# Patient Record
Sex: Female | Born: 1965 | Race: White | Hispanic: No | Marital: Married | Smoking: Never smoker
Health system: Southern US, Community
[De-identification: ages and names within clinical notes are randomized; demographics above are authoritative.]

---

## 2007-09-18 ENCOUNTER — Ambulatory Visit: Payer: Self-pay | Admitting: Obstetrics & Gynecology

## 2008-08-11 ENCOUNTER — Ambulatory Visit: Payer: Self-pay | Admitting: Physician Assistant

## 2012-02-20 ENCOUNTER — Ambulatory Visit: Payer: Self-pay | Admitting: Neurology

## 2012-10-25 ENCOUNTER — Ambulatory Visit: Payer: Self-pay | Admitting: Family Medicine

## 2013-12-12 ENCOUNTER — Ambulatory Visit: Payer: Self-pay | Admitting: Family Medicine

## 2014-03-04 DIAGNOSIS — R519 Headache, unspecified: Secondary | ICD-10-CM | POA: Insufficient documentation

## 2014-03-04 DIAGNOSIS — R42 Dizziness and giddiness: Secondary | ICD-10-CM | POA: Insufficient documentation

## 2014-03-04 DIAGNOSIS — H538 Other visual disturbances: Secondary | ICD-10-CM | POA: Insufficient documentation

## 2014-03-04 DIAGNOSIS — R51 Headache: Secondary | ICD-10-CM

## 2014-03-04 DIAGNOSIS — R200 Anesthesia of skin: Secondary | ICD-10-CM | POA: Insufficient documentation

## 2014-03-04 DIAGNOSIS — R202 Paresthesia of skin: Secondary | ICD-10-CM

## 2014-04-17 DIAGNOSIS — G44229 Chronic tension-type headache, not intractable: Secondary | ICD-10-CM | POA: Insufficient documentation

## 2015-04-23 DIAGNOSIS — E669 Obesity, unspecified: Secondary | ICD-10-CM | POA: Insufficient documentation

## 2015-08-07 ENCOUNTER — Other Ambulatory Visit: Payer: Self-pay | Admitting: Adult Health

## 2015-08-07 DIAGNOSIS — R221 Localized swelling, mass and lump, neck: Secondary | ICD-10-CM

## 2015-08-11 ENCOUNTER — Ambulatory Visit
Admission: RE | Admit: 2015-08-11 | Discharge: 2015-08-11 | Disposition: A | Discharge status: Home / Self Care | Payer: 59 | Source: Ambulatory Visit | Attending: Adult Health | Admitting: Adult Health

## 2015-08-11 DIAGNOSIS — R221 Localized swelling, mass and lump, neck: Secondary | ICD-10-CM | POA: Insufficient documentation

## 2015-09-08 ENCOUNTER — Other Ambulatory Visit: Payer: Self-pay | Admitting: Adult Health

## 2015-09-08 DIAGNOSIS — R221 Localized swelling, mass and lump, neck: Secondary | ICD-10-CM

## 2015-09-11 ENCOUNTER — Ambulatory Visit
Admission: RE | Admit: 2015-09-11 | Discharge: 2015-09-11 | Disposition: A | Discharge status: Home / Self Care | Payer: 59 | Source: Ambulatory Visit | Attending: Adult Health | Admitting: Adult Health

## 2015-09-11 DIAGNOSIS — R221 Localized swelling, mass and lump, neck: Secondary | ICD-10-CM | POA: Diagnosis present

## 2015-09-11 MED ORDER — IOPAMIDOL (ISOVUE-370) INJECTION 76%
75.0000 mL | Freq: Once | INTRAVENOUS | Status: AC | PRN
  Administered 2015-09-11: 75 mL via INTRAVENOUS

## 2015-10-16 ENCOUNTER — Other Ambulatory Visit (HOSPITAL_COMMUNITY): Payer: Self-pay | Admitting: Neurosurgery

## 2015-10-16 DIAGNOSIS — M542 Cervicalgia: Secondary | ICD-10-CM

## 2015-11-10 ENCOUNTER — Ambulatory Visit
Admission: RE | Admit: 2015-11-10 | Discharge: 2015-11-10 | Disposition: A | Discharge status: Home / Self Care | Payer: 59 | Source: Ambulatory Visit | Attending: Neurosurgery | Admitting: Neurosurgery

## 2015-11-10 DIAGNOSIS — M542 Cervicalgia: Secondary | ICD-10-CM

## 2015-11-10 DIAGNOSIS — M50321 Other cervical disc degeneration at C4-C5 level: Secondary | ICD-10-CM | POA: Diagnosis not present

## 2015-11-13 ENCOUNTER — Other Ambulatory Visit (HOSPITAL_COMMUNITY): Payer: Self-pay | Admitting: Neurosurgery

## 2015-11-13 DIAGNOSIS — M542 Cervicalgia: Secondary | ICD-10-CM

## 2018-05-23 IMAGING — MR MR CERVICAL SPINE W/O CM
5 series · 33 of 48 positions shown · non-contrast
Comparison: None.

CLINICAL DATA: Cervicalgia, left arm pain, numbness

EXAM:
MRI CERVICAL SPINE WITHOUT CONTRAST
TECHNIQUE: Multiplanar, multisequence MR imaging of the cervical spine was
performed. No intravenous contrast was administered.

[Series 2: T2 · sagittal · 3.0mm · 0.56mm/px · 6 of 13 slices shown (1 of 2)]
[im 1/13]
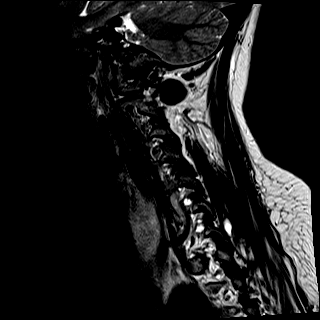
[im 3/13]
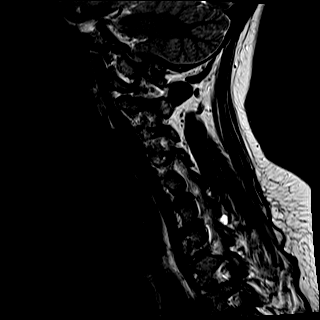
[im 5/13]
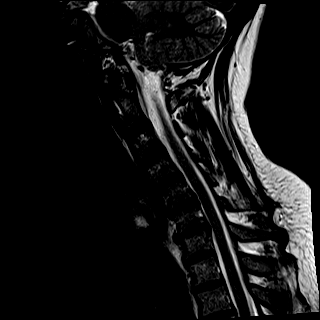
[im 8/13]
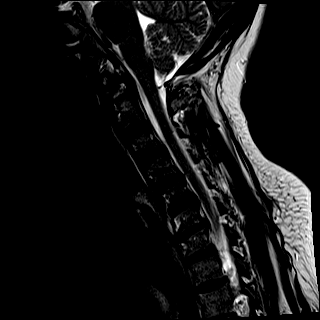
[im 10/13]
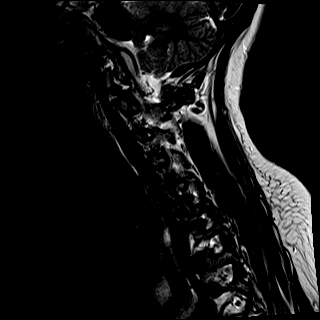
[im 13/13]
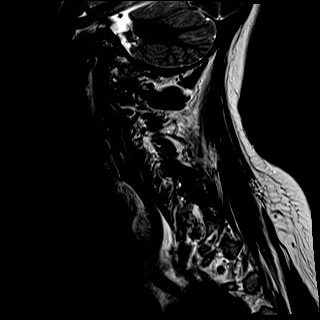

[Series 4: STIR · sagittal · 3.0mm · 0.35mm/px · 7 of 13 slices shown]
[im 1/13]
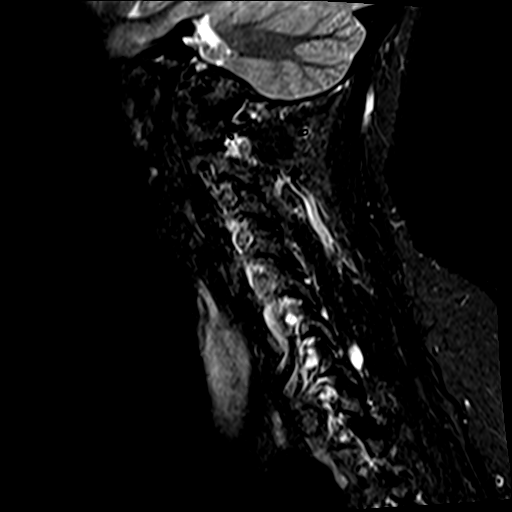
[im 3/13]
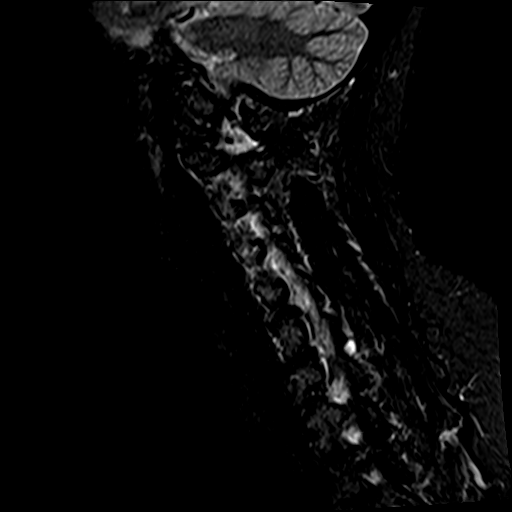
[im 5/13]
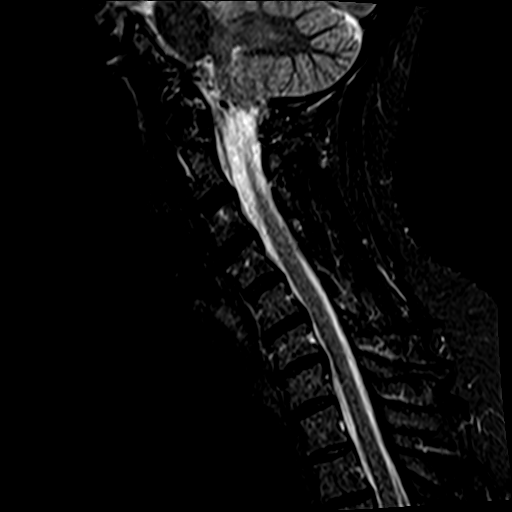
[im 7/13]
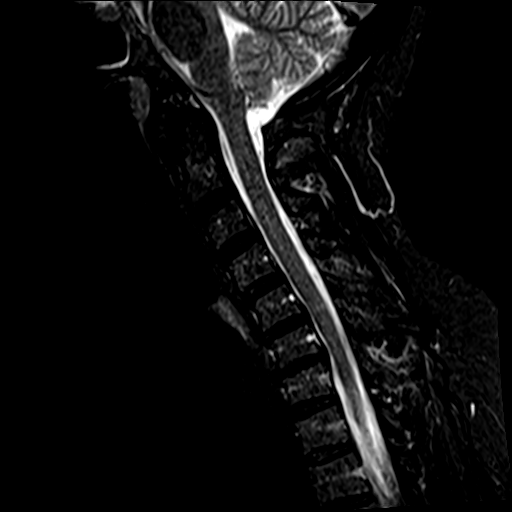
[im 9/13]
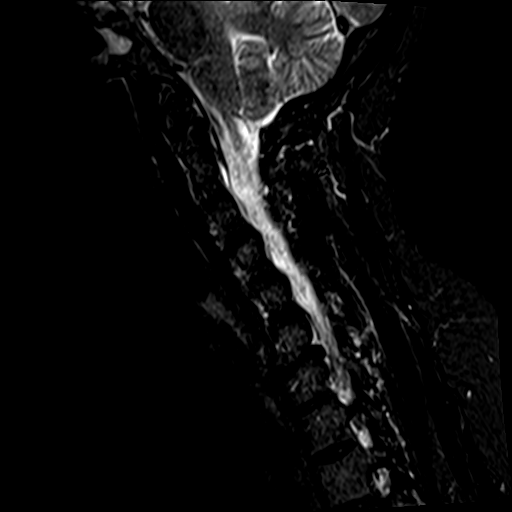
[im 11/13]
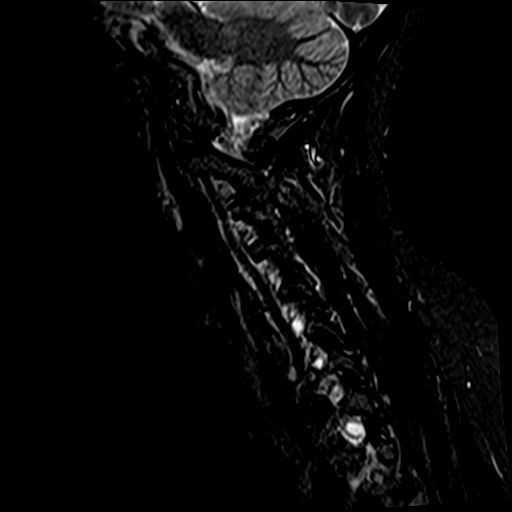
[im 13/13]
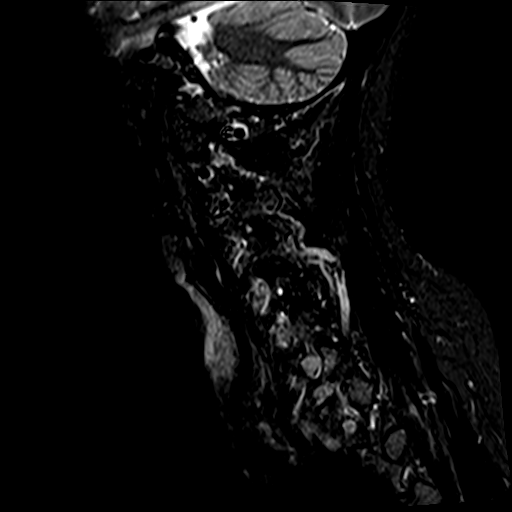

[Series 5: T2 · axial · 3.0mm · 0.62mm/px · z∈[-76,+12]mm · 8 of 26 slices shown (2 of 2)]
[im 1/26]
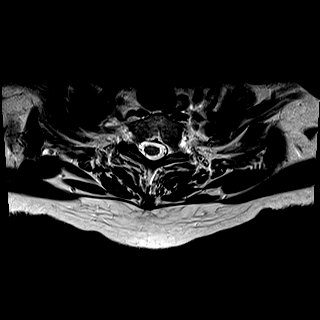
[im 4/26]
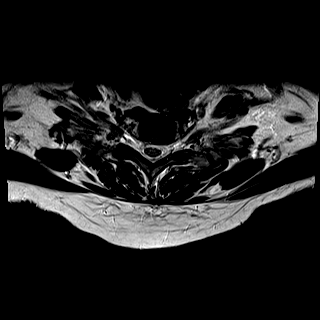
[im 8/26]
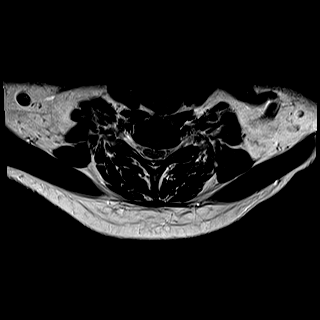
[im 12/26]
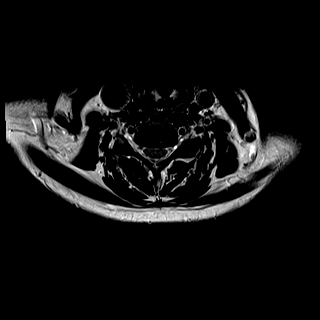
[im 14/26]
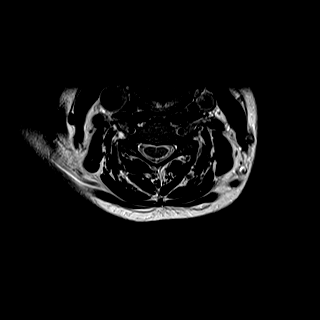
[im 18/26]
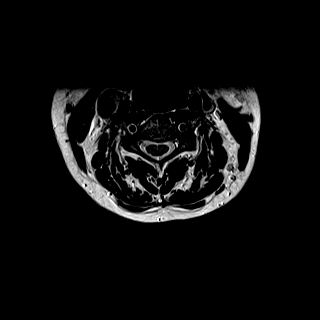
[im 22/26]
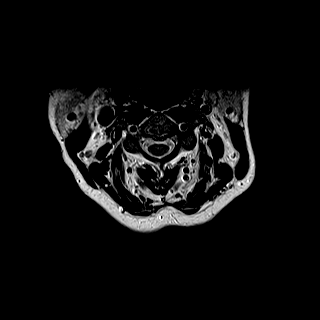
[im 26/26]
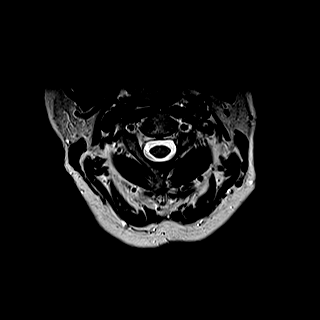

[Series 6: mpgr ax · axial · 3.0mm · 0.35mm/px · z∈[-63,-17]mm · 5 of 26 slices shown]
[im 1/26]
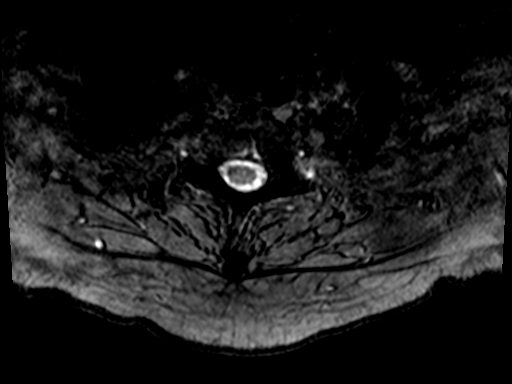
[im 4/26]
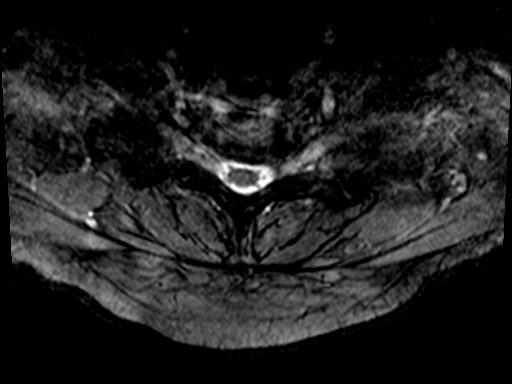
[im 8/26]
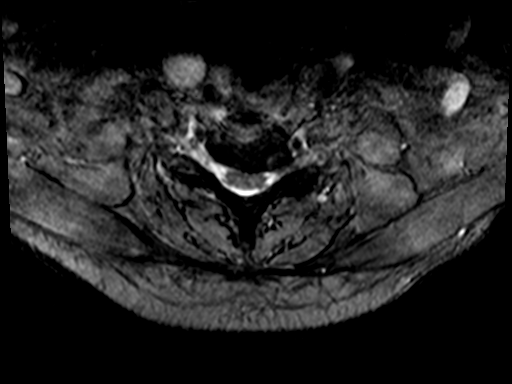
[im 12/26]
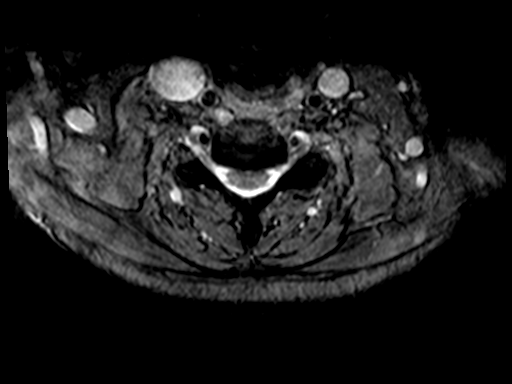
[im 14/26]
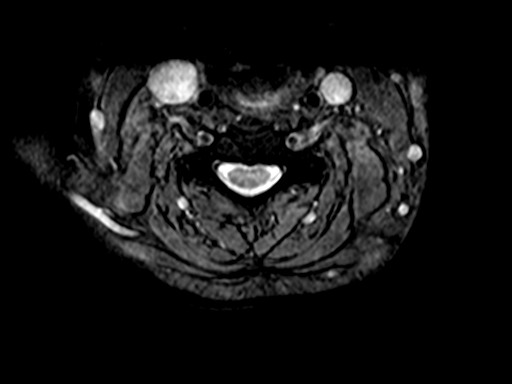

[Series 7: T1 · sagittal · 3.0mm · 0.70mm/px · 7 of 13 slices shown]
[im 1/13]
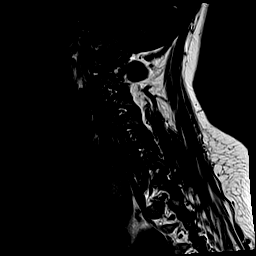
[im 3/13]
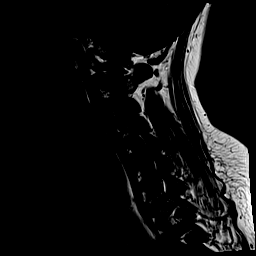
[im 5/13]
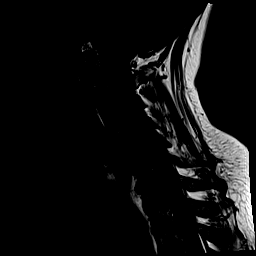
[im 7/13]
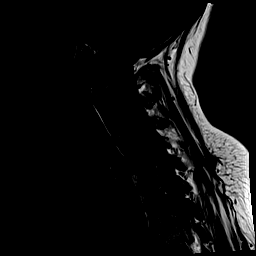
[im 9/13]
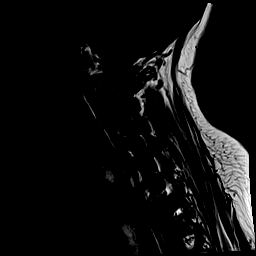
[im 11/13]
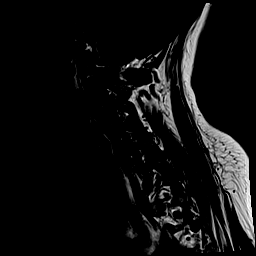
[im 13/13]
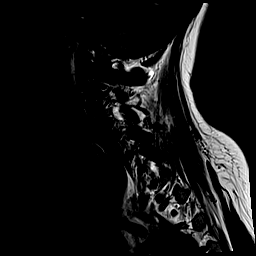

[33 of 48 positions shown; findings below may reference images not displayed]

FINDINGS: Alignment: Physiologic. Loss of the normal cervical lordosis with
mild kyphosis.

Vertebrae: No fracture, evidence of discitis, or bone lesion.

Cord: Normal signal and morphology.

Posterior Fossa, vertebral arteries, paraspinal tissues: Negative

Disc levels:

Discs: Minimal degenerative disc disease at C6-7.

C2-3: No significant disc bulge. No neural foraminal stenosis. No
central canal stenosis.

C3-4: No significant disc bulge. No neural foraminal stenosis. No
central canal stenosis.

C4-5: No significant disc bulge. No neural foraminal stenosis. No
central canal stenosis.

C5-6: No significant disc bulge. Left uncovertebral degenerative
changes with left foraminal narrowing. No central canal stenosis.

C6-7: No significant disc bulge. Left uncovertebral degenerative
change with left foraminal narrowing. No central canal stenosis.
Small bilateral perineural cysts.

C7-T1: No significant disc bulge. No neural foraminal stenosis. No
central canal stenosis.
IMPRESSION: 1. At C5-6 there is left uncovertebral degenerative change with mild
left foraminal narrowing.
2. At C6-7 there is left uncovertebral degenerative change mild left
foraminal narrowing.

## 2018-07-16 ENCOUNTER — Encounter: Payer: Self-pay | Admitting: Physician Assistant

## 2018-07-16 ENCOUNTER — Ambulatory Visit: Payer: Self-pay | Admitting: Physician Assistant

## 2018-07-16 VITALS — BP 120/80 | HR 94 | Temp 97.9°F | Resp 16 | Ht 66.0 in | Wt 207.0 lb

## 2018-07-16 DIAGNOSIS — J40 Bronchitis, not specified as acute or chronic: Secondary | ICD-10-CM

## 2018-07-16 DIAGNOSIS — R6889 Other general symptoms and signs: Secondary | ICD-10-CM

## 2018-07-16 LAB — POCT INFLUENZA A/B
INFLUENZA A, POC: NEGATIVE
Influenza B, POC: NEGATIVE

## 2018-07-16 MED ORDER — PREDNISONE 20 MG PO TABS: 40.0000 mg | ORAL_TABLET | Freq: Every day | ORAL | 0 refills | Status: AC

## 2018-07-16 MED ORDER — ALBUTEROL SULFATE HFA 108 (90 BASE) MCG/ACT IN AERS: 2.0000 | INHALATION_SPRAY | RESPIRATORY_TRACT | 0 refills | Status: AC | PRN

## 2018-07-16 NOTE — Patient Instructions (Addendum)
Thank you for choosing [facility] for your health care needs.  You have been diagnosed with bronchitis / flu-like symptoms.  Your rapid flu test, performed in the clinic today, was NEGATIVE.  Recommend you increase fluids; water, Gatorade, hot tea with lemon/honey or orange juice. Rest. Take over the counter Tylenol or ibuprofen for fever, headache, and/or body aches.  May use over the counter cough & cold medication for symptom relief. Such as Sudafed for congestion, Delsym for cough, Claritin for sneezing, etc.  Take prescription medications as prescribed: Meds ordered this encounter  Medications  . predniSONE (DELTASONE) 20 MG tablet    Sig: Take 2 tablets (40 mg total) by mouth daily with breakfast for 5 days.    Dispense:  10 tablet    Refill:  0    Order Specific Question:   Supervising Provider    Answer:   Hyacinth Meeker, BRIAN [[id number]]  . albuterol (PROVENTIL HFA;VENTOLIN HFA) 108 (90 Base) MCG/ACT inhaler    Sig: Inhale 2 puffs into the lungs every 4 (four) hours as needed.    Dispense:  1 Inhaler    Refill:  0    Order Specific Question:   Supervising Provider    Answer:   Eber Hong [[id number]]   Follow-up with family physician or urgent care in 4-5 days if symptoms not improving. Sooner with any worsening symptoms.  Hope you feel better soon!  Flu-Like Symptoms, Adult Influenza is also called "the flu." It is an infection in the lungs, nose, and throat (respiratory tract). It is caused by a virus. The flu causes symptoms that are similar to symptoms of a cold. It also causes a high fever and body aches. The flu spreads easily from person to person (is contagious). Getting a flu shot (influenza vaccination) every year is the best way to prevent the flu. What are the causes? This condition is caused by the influenza virus. You can get the virus by:  Breathing in droplets that are in the air from the cough or sneeze of a person who has the virus.  Touching something that has the  virus on it (is contaminated) and then touching your mouth, nose, or eyes. What increases the risk? Certain things may make you more likely to get the flu. These include:  Not washing your hands often.  Having close contact with many people during cold and flu season.  Touching your mouth, eyes, or nose without first washing your hands.  Not getting a flu shot every year. You may have a higher risk for the flu, along with serious problems such as a lung infection (pneumonia), if you:  Are older than 65.  Are pregnant.  Have a weakened disease-fighting system (immune system) because of a disease or taking certain medicines.  Have a long-term (chronic) illness, such as: ? Heart, kidney, or lung disease. ? Diabetes. ? Asthma.  Have a liver disorder.  Are very overweight (morbidly obese).  Have anemia. This is a condition that affects your red blood cells. What are the signs or symptoms? Symptoms usually begin suddenly and last 4-14 days. They may include:  Fever and chills.  Headaches, body aches, or muscle aches.  Sore throat.  Cough.  Runny or stuffy (congested) nose.  Chest discomfort.  Not wanting to eat as much as normal (poor appetite).  Weakness or feeling tired (fatigue).  Dizziness.  Feeling sick to your stomach (nauseous) or throwing up (vomiting). How is this treated? If the flu is found early, you can  be treated with medicine that can help reduce how bad the illness is and how long it lasts (antiviral medicine). This may be given by mouth (orally) or through an IV tube. Taking care of yourself at home can help your symptoms get better. Your doctor may suggest:  Taking over-the-counter medicines.  Drinking plenty of fluids. The flu often goes away on its own. If you have very bad symptoms or other problems, you may be treated in a hospital. Follow these instructions at home:     Activity  Rest as needed. Get plenty of sleep.  Stay home from  work or school as told by your doctor. ? Do not leave home until you do not have a fever for 24 hours without taking medicine. ? Leave home only to visit your doctor. Eating and drinking  Take an ORS (oral rehydration solution). This is a drink that is sold at pharmacies and stores.  Drink enough fluid to keep your pee (urine) pale yellow.  Drink clear fluids in small amounts as you are able. Clear fluids include: ? Water. ? Ice chips. ? Fruit juice that has water added (diluted fruit juice). ? Low-calorie sports drinks.  Eat bland, easy-to-digest foods in small amounts as you are able. These foods include: ? Bananas. ? Applesauce. ? Rice. ? Lean meats. ? Toast. ? Crackers.  Do not eat or drink: ? Fluids that have a lot of sugar or caffeine. ? Alcohol. ? Spicy or fatty foods. General instructions  Take over-the-counter and prescription medicines only as told by your doctor.  Use a cool mist humidifier to add moisture to the air in your home. This can make it easier for you to breathe.  Cover your mouth and nose when you cough or sneeze.  Wash your hands with soap and water often, especially after you cough or sneeze. If you cannot use soap and water, use alcohol-based hand sanitizer.  Keep all follow-up visits as told by your doctor. This is important. How is this prevented?   Get a flu shot every year. You may get the flu shot in late summer, fall, or winter. Ask your doctor when you should get your flu shot.  Avoid contact with people who are sick during fall and winter (cold and flu season). Contact a doctor if:  You get new symptoms.  You have: ? Chest pain. ? Watery poop (diarrhea). ? A fever.  Your cough gets worse.  You start to have more mucus.  You feel sick to your stomach.  You throw up. Get help right away if you:  Have shortness of breath.  Have trouble breathing.  Have skin or nails that turn a bluish color.  Have very bad pain or  stiffness in your neck.  Get a sudden headache.  Get sudden pain in your face or ear.  Cannot eat or drink without throwing up. Summary  Influenza ("the flu") is an infection in the lungs, nose, and throat. It is caused by a virus.  Take over-the-counter and prescription medicines only as told by your doctor.  Getting a flu shot every year is the best way to avoid getting the flu. This information is not intended to replace advice given to you by your health care provider. Make sure you discuss any questions you have with your health care provider. Document Released: 03/01/2008 Document Revised: 11/08/2017 Document Reviewed: 11/08/2017 Elsevier Interactive Patient Education  2019 [employer].

## 2018-07-16 NOTE — Progress Notes (Signed)
Patient ID: [name] DOB: July 15, 1965 AGE: 53 y.o. MRN: [id number]   PCP: Patient, No Pcp Per   Chief Complaint:  Chief Complaint  Patient presents with  . Cough    x2d  . Nasal Congestion    x2d  . Fever    x2d     Subjective:    HPI:  Joann Robles is a 53 y.o. female presents for evaluation  Chief Complaint  Patient presents with  . Cough    x2d  . Nasal Congestion    x2d  . Fever    x36d    53 year old female [facility] with three day history of cough. Began Saturday 07/14/2018 morning with chest congestion. Cough initially dry. Now productive. Coarse sounding. Associated headache (better today than yesterday), nasal congestion and low grade fever (28F, only in the evenings). Patient reports minimal shortness of breath with physical exertion. Denies high fever, dizziness/lightheadedness, sinus pain/pressure, sore throat, ear pain, chest pain, wheezing, nausea/vomiting, diarrhea, body aches. Patient has taken OTC Tussin-DM and Advil with minimal relief. Patient did not receive this season's influenza vaccination (states she never gets the flu shot). Multiple sick co-workers; patient does not know if they have been diagnosed with the flu. Patient's son treated for bronchitis last week. Patient is a former cigarette smoker; quit 25 years ago. No asthma history. No previous albuterol inhaler prescription. Patient denies frequent bronchitis. Patient denies seasonal allergies.  Patient with previous history of bilateral inner ear reconstruction. Patient reports chronic bilateral ear muffled hearing, popping/crackling, and pressure. No change in her normal symptoms/baseline.  A limited review of symptoms was performed, pertinent positives and negatives as mentioned in HPI.  The following portions of the patient's history were reviewed and updated as appropriate: allergies, current medications and past medical history.  Patient Active Problem List   Diagnosis Date Noted  . Obesity, Class II, BMI 35-39.9 04/23/2015  . Chronic tension-type headache, not intractable 04/17/2014  . Acute headache 03/04/2014  . Blurred vision 03/04/2014  . Lightheadedness 03/04/2014  . Numbness and tingling 03/04/2014    No Known Allergies  No current outpatient medications on file prior to visit.   No current facility-administered medications on file prior to visit.        Objective:   Vitals:   07/16/18 1035  BP: 120/80  Pulse: 94  Resp: 16  Temp: 97.9 F (36.6 C)  SpO2: 97%     Wt Readings from Last 3 Encounters:  07/16/18 207 lb (93.9 kg)    Physical Exam:   General Appearance:  Patient sitting comfortably on examination table. Conversational. Peri [place] self-historian. In no acute distress. Afebrile (no antipyretic taken today).  Head:  Normocephalic, without obvious abnormality, atraumatic  Eyes:  PERRL, conjunctiva/corneas clear, EOM's intact  Ears:  Bilateral ear canals WNL. No erythema or edema. No discharge/drainage. Left TM with minimal serous effusion. No erythema or injection. Right TM with extensive scar tissue. No erythema or injection.  Nose: Nares normal, septum midline. Nasal mucosa with minimal bilateral edema. No visible rhinorrhea. No sinus tenderness with percussion/palpation.  Throat: Lips, mucosa, and tongue normal; teeth and gums normal. Throat reveals no erythema. Tonsils with no enlargement or exudate.  Neck: Supple, symmetrical, trachea midline, no adenopathy  Lungs:   Auscultation of lungs reveals very faint end expiratory wheezing in bases bilaterally (right worse than left) with forced expiration. Occasional coarse cough during examination. No rhonchi. No rales.  Heart:  Regular rate and rhythm, S1  and S2 normal, no murmur, rub, or gallop  Extremities: Extremities normal, atraumatic, no cyanosis or edema  Pulses: 2+ and symmetric  Skin: Skin color, texture, turgor normal, no rashes or lesions  Lymph nodes:  Cervical, supraclavicular, and axillary nodes normal  Neurologic: Normal    Assessment & Plan:    Exam findings, diagnosis etiology and medication use and indications reviewed with patient. Follow-Up and discharge instructions provided. No emergent/urgent issues found on exam.  Patient education was provided.   Patient verbalized understanding of information provided and agrees with plan of care (POC), all questions answered. The patient is advised to call or return to clinic if condition does not see an improvement in symptoms, or to seek the care of the closest emergency department if condition worsens with the below plan.    1. Flu-like symptoms - POCT Influenza A/B  2. Bronchitis - predniSONE (DELTASONE) 20 MG tablet; Take 2 tablets (40 mg total) by mouth daily with breakfast for 5 days.  Dispense: 10 tablet; Refill: 0 - albuterol (PROVENTIL HFA;VENTOLIN HFA) 108 (90 Base) MCG/ACT inhaler; Inhale 2 puffs into the lungs every 4 (four) hours as needed.  Dispense: 1 Inhaler; Refill: 0  Patient with 3 day history of cough. Associated chest congestion and wheezing (end expiratory, in bases, on PE). Negative rapid flu test. Suspect patient has uncomplicated viral bronchitis. VSS, afebrile, in no acute distress, no rhonchi/rales on lung auscultation. Prescribed 5-day course of 40mg  qd of prednisone and albuterol inhaler. Advised continuation of increase fluids, rest, OTC Delsym or Robitussin for cough. Advised patient follow-up with PCP or urgent care in 4-5 days if not improving, sooner with any worsening symptoms. Believe at that time, CXR may be warranted for evaluation of CAP.   Janalyn HarderSamantha Stockton Nunley, MHS, PA-C Rulon SeraSamantha F. Kaeya Schiffer, MHS, PA-C Advanced Practice Provider [facility]  [facility]  [address], [address] [facility], [address] [place], [place] [address] (p):  [phone] [email or web address]

## 2018-07-19 ENCOUNTER — Telehealth: Payer: Self-pay | Admitting: Emergency Medicine

## 2018-07-19 NOTE — Telephone Encounter (Signed)
Attempted to contact patient voicemail came on stating that it was Joann Robles hung up Didn't leave message. [facility]
